# Patient Record
Sex: Male | Born: 2000
Health system: Southern US, Community
[De-identification: ages and names within clinical notes are randomized; demographics above are authoritative.]

## PROBLEM LIST (undated history)

## (undated) DIAGNOSIS — G4700 Insomnia, unspecified: Secondary | ICD-10-CM

## (undated) DIAGNOSIS — I10 Essential (primary) hypertension: Secondary | ICD-10-CM

## (undated) DIAGNOSIS — R4587 Impulsiveness: Secondary | ICD-10-CM

## (undated) HISTORY — PX: TONSILLECTOMY: SUR1361

---
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
## 2019-07-30 IMAGING — DX DG FINGER THUMB 2+V*L*
3 series · 3 of 3 positions shown · non-contrast
Comparison: None.

CLINICAL DATA: Left thumb injury, football injury.

EXAM:
LEFT THUMB 2+V

[finger ap]
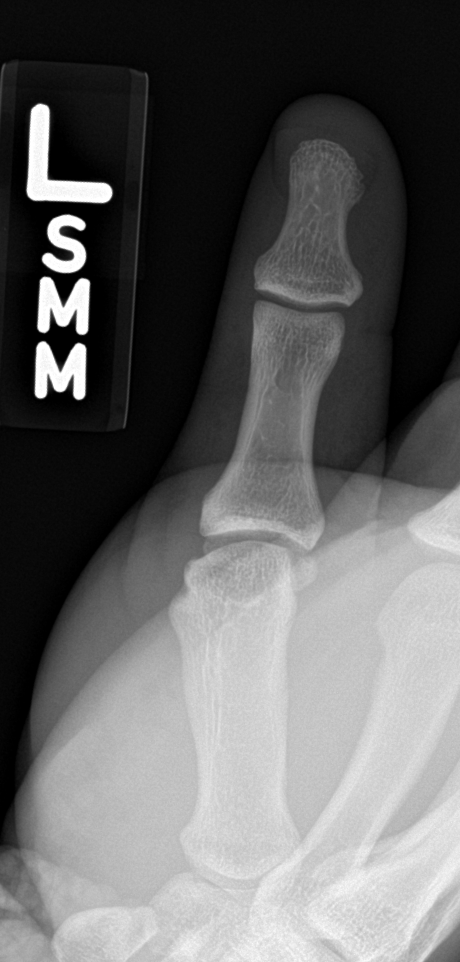

[finger obl]
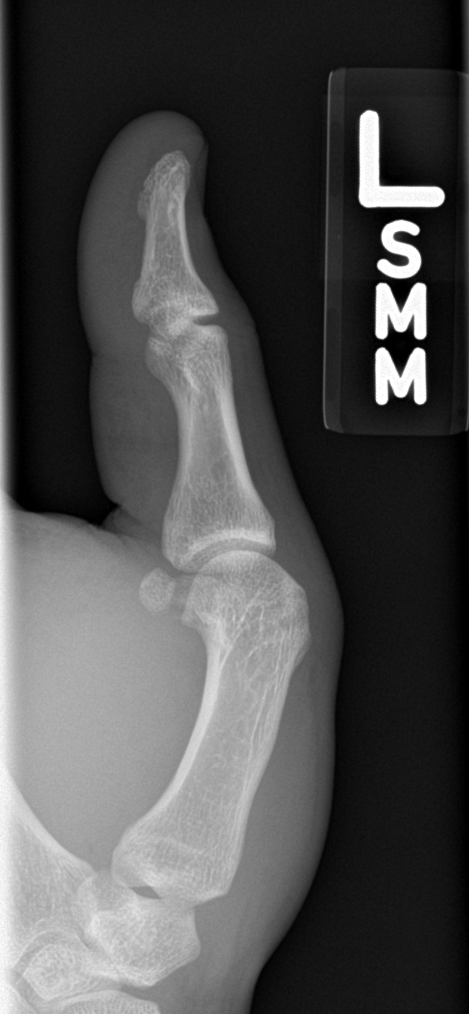

[finger lat]
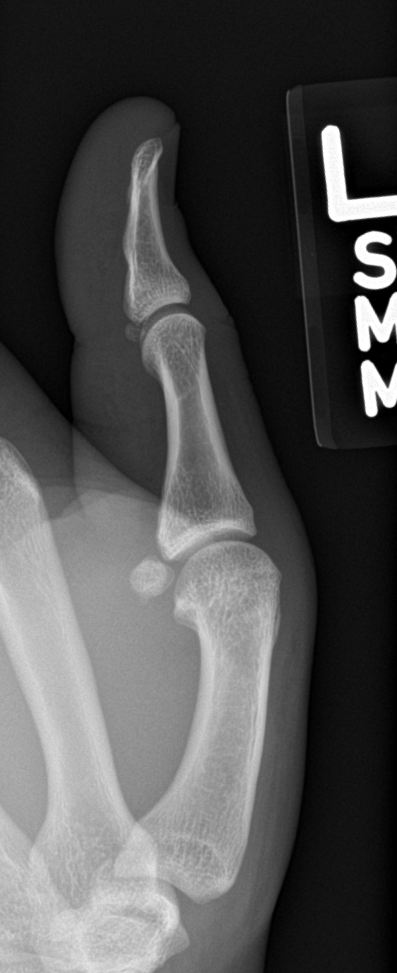

[3 of 3 positions shown; findings below may reference images not displayed]

FINDINGS: There is no evidence of fracture or dislocation. There is no
evidence of arthropathy or other focal bone abnormality. Soft
tissues are unremarkable
IMPRESSION: Negative.

## 2021-03-04 ENCOUNTER — Encounter: Payer: Self-pay | Admitting: Emergency Medicine

## 2021-03-04 ENCOUNTER — Other Ambulatory Visit: Payer: Self-pay

## 2021-03-04 ENCOUNTER — Ambulatory Visit
Admission: EM | Admit: 2021-03-04 | Discharge: 2021-03-04 | Disposition: A | Discharge status: Home / Self Care | Payer: Medicaid Other | Attending: Internal Medicine | Admitting: Internal Medicine

## 2021-03-04 DIAGNOSIS — R030 Elevated blood-pressure reading, without diagnosis of hypertension: Secondary | ICD-10-CM

## 2021-03-04 DIAGNOSIS — Z20822 Contact with and (suspected) exposure to covid-19: Secondary | ICD-10-CM

## 2021-03-04 NOTE — ED Triage Notes (Signed)
Intermittent headache for 2 days, worse today.  Has been taking tylenol.  Pain in forehead.  Patient reports this is an unusual headache, he does not get headaches

## 2021-03-04 NOTE — ED Provider Notes (Signed)
Renaldo Fiddler    CSN: [id number] Arrival date & time: 03/04/21  1536      History   Chief Complaint Chief Complaint  Patient presents with   Headache    HPI Edward Leonard is a 20 y.o. male who presents with intermittent HA x 3 days. The HA is worse today. Has been taking Tylenol 500 mg and hs not been helping.  HA is present on forehead. He states this HA is different for him ,than when he has had them before. Has been feeling achy. Has been fatigued and has had decreased appetite. Denies diarrhea or URI symptoms. Denies close exposure to anyone sick.  Has not done a home covid test.  He work in the heat and tried to hydrate well, drinks about 64 oz of water with no electrolytes.     Past Medical History:  Diagnosis Date   Hypertension    Impulsiveness    Insomnia     There are no problems to display for this patient.   Past Surgical History:  Procedure Laterality Date   TONSILLECTOMY         Home Medications    Prior to Admission medications   Not on File    Family History Family History  Family history unknown: Yes    Social History Social History   Tobacco Use   Smoking status: Never   Smokeless tobacco: Never  Vaping Use   Vaping Use: Some days  Substance Use Topics   Alcohol use: Never   Drug use: Yes    Types: Marijuana     Allergies   Patient has no known allergies.   Review of Systems Review of Systems  Constitutional:  Positive for appetite change, chills and fatigue. Negative for activity change and fever.  HENT:  Negative for congestion, ear discharge, ear pain, rhinorrhea and sore throat.   Respiratory:  Negative for cough, chest tightness and shortness of breath.   Gastrointestinal:  Negative for abdominal pain, diarrhea, nausea and vomiting.  Musculoskeletal:  Positive for myalgias.  Skin:  Negative for rash.  Neurological:  Positive for headaches.  Hematological:  Negative for adenopathy.    Physical Exam Triage  Vital Signs ED Triage Vitals  Enc Vitals Group     BP 03/04/21 1550 (!) 146/85     Pulse Rate 03/04/21 1550 91     Resp 03/04/21 1550 18     Temp 03/04/21 1550 99.7 F (37.6 C)     Temp Source 03/04/21 1550 Oral     SpO2 03/04/21 1550 96 %     Weight --      Height --      Head Circumference --      Peak Flow --      Pain Score 03/04/21 1546 8     Pain Loc --      Pain Edu? --      Excl. in GC? --    No data found.  Updated Vital Signs BP (!) 146/85 (BP Location: Left Arm)   Pulse 91   Temp 99.7 F (37.6 C) (Oral)   Resp 18   SpO2 96%   Visual Acuity Right Eye Distance:   Left Eye Distance:   Bilateral Distance:    Right Eye Near:   Left Eye Near:    Bilateral Near:     Physical Exam Physical Exam Vitals signs and nursing note reviewed.  Constitutional:      General: He is not in acute distress.  Appearance: He is well-developed and normal weight. He is not ill-appearing, toxic-appearing or diaphoretic.  HENT: TM's- clear, nose is normal, Pharynx is clear    Head: Normocephalic.  Eyes:     Extraocular Movements: Extraocular movements intact.     Pupils: Pupils are equal, round, and reactive to light.  Neck:     Musculoskeletal: Neck supple. No neck rigidity.     Cardiovascular:     Rate and Rhythm: Normal rate and regular rhythm.     Heart sounds: No murmur.  Pulmonary:     Effort: Pulmonary effort is normal.     Breath sounds: Normal breath sounds. No wheezing, rhonchi or rales.  Abdominal:     General: Bowel sounds are normal.     Palpations: Abdomen is soft. There is no mass.     Tenderness: There is no abdominal tenderness. There is no guarding.  Musculoskeletal: Normal range of motion.  Lymphadenopathy:     Cervical: No cervical adenopathy.  Skin:    General: Skin is warm and dry.  Neurological:     Mental Status: He is alert.     Cranial Nerves: No cranial nerve deficit or facial asymmetry.     Sensory: No sensory deficit.     Motor: No  weakness.     Coordination: Romberg sign negative. Coordination normal.     Gait: Gait normal.     Deep Tendon Reflexes: Reflexes normal.     Comments: Normal Romberg, finger to nose, tandem gait.   Psychiatric:        Mood and Affect: Mood normal.        Speech: Speech normal.        Behavior: Behavior normal.    UC Treatments / Results  Labs (all labs ordered are listed, but only abnormal results are displayed) Labs Reviewed  NOVEL CORONAVIRUS, NAA    EKG   Radiology No results found.  Procedures Procedures (including critical care time)  Medications Ordered in UC Medications - No data to display  Initial Impression / Assessment and Plan / UC Course  I have reviewed the triage vital signs and the nursing notes. Covid suspect. Covid test sent out and we will inform him if positive. See instructions     Final Clinical Impressions(s) / UC Diagnoses   Final diagnoses:  Suspected COVID-19 virus infection  Elevated blood pressure reading     Discharge Instructions      Push more fluids when you are working in the heat Drink at least 2 bottles of electrolytes per shift.  Check on your Mychart to see your results . Take Tylenol 1000 mg every 6 hours for aches and headache.  Stay quarantined until your covid test is back.  If your Covid test ends up positive you may take the following supplements to help your immune system be stronger to fight this viral infection Take Quarcetin 500 mg three times a day x 7 days with Zinc 50 mg ones a day x 7 days. The quarcetin is an antiviral and anti-inflammatory supplement which helps open the zinc channels in the cell to absorb Zinc. Zinc helps decrease the virus load in your body. Take Melatonin 6-10 mg at bed time which also helps support your immune system.  Also make sure to take Vit D 5,000 IU per day with a fatty meal and Vit C 5000 mg a day until you are completely better. To prevent viral illnesses your vitamin D should  be between 60-80. Stay on Vitamin  D 2,000  and C  1000 mg the rest of the season.  Don't lay around, keep active and walk as much as you are able to to prevent worsening of your symptoms.  Follow up with your family Dr next week.  If you get short of breath and you are able to check  your oxygen with a pulse oxygen meter, if it gets to 92% or less, you need to go to the hospital to be admitted. If you dont have one, come back here and we will assess you.    Do blood pressure readings and if you have 3 or more reading 140/90 or more you need to get back on your blood pressure medication. So go get established with a primary care doctor       ED Prescriptions   None    PDMP not reviewed this encounter.   Garey Ham, [place] 03/04/21 1642

## 2021-03-04 NOTE — Discharge Instructions (Addendum)
Push more fluids when you are working in the heat Drink at least 2 bottles of electrolytes per shift.  Check on your Mychart to see your results . Take Tylenol 1000 mg every 6 hours for aches and headache.  Stay quarantined until your covid test is back.  If your Covid test ends up positive you may take the following supplements to help your immune system be stronger to fight this viral infection Take Quarcetin 500 mg three times a day x 7 days with Zinc 50 mg ones a day x 7 days. The quarcetin is an antiviral and anti-inflammatory supplement which helps open the zinc channels in the cell to absorb Zinc. Zinc helps decrease the virus load in your body. Take Melatonin 6-10 mg at bed time which also helps support your immune system.  Also make sure to take Vit D 5,000 IU per day with a fatty meal and Vit C 5000 mg a day until you are completely better. To prevent viral illnesses your vitamin D should be between 60-80. Stay on Vitamin D 2,000  and C  1000 mg the rest of the season.  Don't lay around, keep active and walk as much as you are able to to prevent worsening of your symptoms.  Follow up with your family Dr next week.  If you get short of breath and you are able to check  your oxygen with a pulse oxygen meter, if it gets to 92% or less, you need to go to the hospital to be admitted. If you dont have one, come back here and we will assess you.    Do blood pressure readings and if you have 3 or more reading 140/90 or more you need to get back on your blood pressure medication. So go get established with a primary care doctor

## 2021-03-07 LAB — NOVEL CORONAVIRUS, NAA

## 2021-03-08 ENCOUNTER — Other Ambulatory Visit: Payer: Self-pay

## 2021-03-08 ENCOUNTER — Ambulatory Visit
Admission: EM | Admit: 2021-03-08 | Discharge: 2021-03-08 | Disposition: A | Discharge status: Home / Self Care | Payer: Medicaid Other | Attending: Emergency Medicine | Admitting: Emergency Medicine

## 2021-03-08 DIAGNOSIS — Z1152 Encounter for screening for COVID-19: Secondary | ICD-10-CM

## 2021-03-08 NOTE — ED Triage Notes (Signed)
Pt needs recollection of covid

## 2021-03-10 LAB — SARS-COV-2, NAA 2 DAY TAT

## 2021-03-10 LAB — NOVEL CORONAVIRUS, NAA: SARS-CoV-2, NAA: DETECTED — AB

## 2021-04-29 ENCOUNTER — Ambulatory Visit
Admission: RE | Admit: 2021-04-29 | Discharge: 2021-04-29 | Disposition: A | Discharge status: Home / Self Care | Payer: Medicaid Other | Source: Ambulatory Visit | Attending: Urgent Care | Admitting: Urgent Care

## 2021-04-29 ENCOUNTER — Other Ambulatory Visit: Payer: Self-pay

## 2021-04-29 ENCOUNTER — Ambulatory Visit (INDEPENDENT_AMBULATORY_CARE_PROVIDER_SITE_OTHER): Payer: Medicaid Other

## 2021-04-29 VITALS — BP 159/89 | HR 78 | Temp 99.1°F

## 2021-04-29 DIAGNOSIS — M25562 Pain in left knee: Secondary | ICD-10-CM

## 2021-04-29 DIAGNOSIS — S86912A Strain of unspecified muscle(s) and tendon(s) at lower leg level, left leg, initial encounter: Secondary | ICD-10-CM

## 2021-04-29 MED ORDER — NAPROXEN 500 MG PO TABS: 500.0000 mg | ORAL_TABLET | Freq: Two times a day (BID) | ORAL | 0 refills | Status: DC

## 2021-04-29 NOTE — Discharge Instructions (Addendum)
I am going to manage your knee pain for knee strain.  Please use naproxen for pain and inflammation.  Use the Ace wrap during the day for the next week.  If your knee pain continues then follow-up with [facility] that they can pursue more imaging.

## 2021-04-29 NOTE — ED Triage Notes (Signed)
Pt injured left leg jumping on a trampoline x 3 days

## 2021-04-29 NOTE — ED Provider Notes (Signed)
Edward Leonard   MRN: [id number] DOB: 05-Mar-2001  Subjective:   Edward Leonard is a 20 y.o. male presenting for 3-day history of persistent left knee pain over the back of his knee.  He has had difficulty bending and straightening out his leg.  Symptoms started when he was at a trampoline park, was at the parkour area and lost his grip, as he came down landed awkwardly and reports that his knee came all the way up to his chest.  Has since had his current symptoms.  No current facility-administered medications for this encounter. No current outpatient medications on file.   No Known Allergies  Past Medical History:  Diagnosis Date   Hypertension    Impulsiveness    Insomnia      Past Surgical History:  Procedure Laterality Date   TONSILLECTOMY      Family History  Family history unknown: Yes    Social History   Tobacco Use   Smoking status: Never   Smokeless tobacco: Never  Vaping Use   Vaping Use: Some days  Substance Use Topics   Alcohol use: Never   Drug use: Yes    Types: Marijuana    ROS   Objective:   Vitals: BP (!) 159/89 (BP Location: Left Arm)   Pulse 78   Temp 99.1 F (37.3 C) (Oral)   SpO2 97%   Physical Exam Constitutional:      General: He is not in acute distress.    Appearance: Normal appearance. He is well-developed and normal weight. He is not ill-appearing, toxic-appearing or diaphoretic.  HENT:     Head: Normocephalic and atraumatic.     Right Ear: External ear normal.     Left Ear: External ear normal.     Nose: Nose normal.     Mouth/Throat:     Pharynx: Oropharynx is clear.  Eyes:     General: No scleral icterus.       Right eye: No discharge.        Left eye: No discharge.     Extraocular Movements: Extraocular movements intact.     Pupils: Pupils are equal, round, and reactive to light.  Cardiovascular:     Rate and Rhythm: Normal rate.  Pulmonary:     Effort: Pulmonary effort is normal.  Musculoskeletal:      Cervical back: Normal range of motion.     Left knee: No swelling, deformity, erythema, ecchymosis, lacerations, bony tenderness or crepitus. Decreased range of motion. Tenderness (Posteriorly) present. No medial joint line, lateral joint line or patellar tendon tenderness. Normal alignment and normal patellar mobility.  Neurological:     Mental Status: He is alert and oriented to person, place, and time.  Psychiatric:        Mood and Affect: Mood normal.        Behavior: Behavior normal.        Thought Content: Thought content normal.        Judgment: Judgment normal.    DG Knee Complete 4 Views Left  Result Date: 04/29/2021 CLINICAL DATA:  Left knee pain. EXAM: LEFT KNEE - COMPLETE 4+ VIEW COMPARISON:  None. FINDINGS: No evidence of fracture, dislocation, or joint effusion. No evidence of arthropathy or other focal bone abnormality. Soft tissues are unremarkable. IMPRESSION: Negative. Electronically Signed   By: Signa Kell M.D.   On: 04/29/2021 09:22     Assessment and Plan :   PDMP not reviewed this encounter.  1. Acute pain of left knee  2. Knee strain, left, initial encounter    Will manage conservatively for knee strain with NSAID, rest and modification of physical activity.  Anticipatory guidance provided.  Follow-up with Ortho if symptoms persist.  A 4 inch Ace wrap was applied to the left knee.  Counseled patient on potential for adverse effects with medications prescribed/recommended today, ER and return-to-clinic precautions discussed, patient verbalized understanding.     Edward Leonard, [place] 04/29/21 [phone]

## 2021-11-12 ENCOUNTER — Ambulatory Visit (HOSPITAL_COMMUNITY)
Admission: EM | Admit: 2021-11-12 | Discharge: 2021-11-12 | Disposition: A | Discharge status: Home / Self Care | Payer: Medicaid Other

## 2021-11-12 ENCOUNTER — Ambulatory Visit (INDEPENDENT_AMBULATORY_CARE_PROVIDER_SITE_OTHER): Payer: Medicaid Other

## 2021-11-12 ENCOUNTER — Encounter (HOSPITAL_COMMUNITY): Payer: Self-pay | Admitting: Emergency Medicine

## 2021-11-12 DIAGNOSIS — M79672 Pain in left foot: Secondary | ICD-10-CM

## 2021-11-12 NOTE — ED Triage Notes (Signed)
Patient c/o LFT foot pain x 2 days.  ? ?Patient endorses hitting foot at the "corner of the bed trying to go down stairs".  ? ?Patient has bruising present to dorsal side of foot.  ? ?Patient endorses increased pain with movement and ambulation.  ? ?Patient has taken Tylenol with no relief of symptoms.  ?

## 2021-11-12 NOTE — Discharge Instructions (Signed)
As we discussed, your x-ray did not show any fractures.  This is likely just a bruise.  It will continue to improve over the next week.  You can ice it, you can also use Tylenol or ibuprofen as needed for the pain.  It is okay for you to go back to work.  Come back if your pain is not improving over the next 1 to 2 weeks. ?

## 2021-11-12 NOTE — ED Provider Notes (Signed)
?[facility] ? ? ? ?CSN: [id number] ?Arrival date & time: 11/12/21  1120 ? ? ?  ? ?History   ?Chief Complaint ?Chief Complaint  ?Patient presents with  ? Foot Injury  ? ? ?HPI ?Edward Leonard is a 21 y.o. male.  ? ?Left hip pain ?Reports that he hit his left foot on the bed 2 days ago ?He had some bruising on the dorsal aspect of his foot near the second and third toes ?He went to work yesterday, but given that he had had an injury, they recommended that he be evaluated to ensure that he is okay to work ?He states that his sore and bruised, the swelling has been improving ?He is able to move his toes per his report ?States that walking has been fine ? ? ?Past Medical History:  ?Diagnosis Date  ? Hypertension   ? Impulsiveness   ? Insomnia   ? ? ?There are no problems to display for this patient. ? ? ?Past Surgical History:  ?Procedure Laterality Date  ? TONSILLECTOMY    ? ? ? ? ? ?Home Medications   ? ?Prior to Admission medications   ?Medication Sig Start Date End Date Taking? Authorizing Provider  ?acetaminophen (TYLENOL) 500 MG tablet Take 500 mg by mouth every 6 (six) hours as needed.   Yes [provider]  ?naproxen (NAPROSYN) 500 MG tablet Take 1 tablet (500 mg total) by mouth 2 (two) times daily with a meal. 04/29/21   Wallis Bamberg, PA-C  ? ? ?Family History ?Family History  ?Family history unknown: Yes  ? ? ?Social History ?Social History  ? ?Tobacco Use  ? Smoking status: Never  ? Smokeless tobacco: Never  ?Vaping Use  ? Vaping Use: Some days  ?Substance Use Topics  ? Alcohol use: Never  ? Drug use: Yes  ?  Types: Marijuana  ? ? ? ?Allergies   ?Patient has no known allergies. ? ? ?Review of Systems ?Review of Systems  ?All other systems reviewed and are negative. ? ?Per HPI ?Physical Exam ?Triage Vital Signs ?ED Triage Vitals [11/12/21 1210]  ?Enc Vitals Group  ?   BP   ?   Pulse   ?   Resp   ?   Temp   ?   Temp src   ?   SpO2   ?   Weight   ?   Height   ?   Head Circumference   ?   Peak  Flow   ?   Pain Score 8  ?   Pain Loc   ?   Pain Edu?   ?   Excl. in GC?   ? ?No data found. ? ?Updated Vital Signs ?BP (!) 136/93 (BP Location: Left Arm)   Pulse 72   Temp 98.1 ?F (36.7 ?C) (Oral)   Resp 16   SpO2 95%  ? ?Visual Acuity ?Right Eye Distance:   ?Left Eye Distance:   ?Bilateral Distance:   ? ?Right Eye Near:   ?Left Eye Near:    ?Bilateral Near:    ? ?Physical Exam ?Constitutional:   ?   General: He is not in acute distress. ?   Appearance: Normal appearance. He is not ill-appearing.  ?HENT:  ?   Head: Normocephalic and atraumatic.  ?Eyes:  ?   Conjunctiva/sclera: Conjunctivae normal.  ?Cardiovascular:  ?   Rate and Rhythm: Normal rate.  ?Pulmonary:  ?   Effort: Pulmonary effort is normal. No respiratory distress.  ?Musculoskeletal:  ?  Cervical back: Normal range of motion.  ?   Comments: Left foot: ?Inspection:  No obvious bony deformity b/l.  There is mild dorsal swelling and a small amount of ecchymosis at the base of the second and third digits. ?Palpation: There is mild tenderness palpation in the region of the proximal phalanx of the second and third digits, otherwise no specific tenderness to palpation.  None over midfoot, fifth metatarsal, medial, lateral malleolus specifically. ?ROM: Full  ROM of the ankle b/l.  Able to move all toes. ?Strength: 5/5 strength ankle in all planes b/l ?Neurovascular: N/V intact distally in the lower extremity b/l ? ?  ?Skin: ?   General: Skin is warm and dry.  ?Neurological:  ?   Mental Status: He is alert and oriented to person, place, and time.  ?Psychiatric:     ?   Mood and Affect: Mood normal.     ?   Behavior: Behavior normal.  ? ? ? ?UC Treatments / Results  ?Labs ?(all labs ordered are listed, but only abnormal results are displayed) ?Labs Reviewed - No data to display ? ?EKG ? ? ?Radiology ?DG Foot Complete Left ? ?Result Date: 11/12/2021 ?CLINICAL DATA:  Trauma, pain x2 days EXAM: LEFT FOOT - COMPLETE 3+ VIEW COMPARISON:  05/15/2017 FINDINGS: No  fracture or dislocation is seen. There is soft tissue swelling over the dorsum. Small bony spurs are noted in the dorsal aspect of talonavicular joint. No significant interval changes are noted. IMPRESSION: No recent fracture or dislocation is seen in the left foot. Electronically Signed   By: Ernie Avena M.D.   On: 11/12/2021 12:46   ? ?Procedures ?Procedures (including critical care time) ? ?Medications Ordered in UC ?Medications - No data to display ? ?Initial Impression / Assessment and Plan / UC Course  ?I have reviewed the triage vital signs and the nursing notes. ? ?Pertinent labs & imaging results that were available during my care of the patient were reviewed by me and considered in my medical decision making (see chart for details). ? ?  ? ?X-rays did not show any fractures or dislocation.  Exam consistent with a contusion.  Patient given a note that states he is able to go back to work.  Recommend ice, oral Tylenol or ibuprofen as needed for pain. ? ? ?Final Clinical Impressions(s) / UC Diagnoses  ? ?Final diagnoses:  ?Left foot pain  ? ? ? ?Discharge Instructions   ? ?  ?As we discussed, your x-ray did not show any fractures.  This is likely just a bruise.  It will continue to improve over the next week.  You can ice it, you can also use Tylenol or ibuprofen as needed for the pain.  It is okay for you to go back to work.  Come back if your pain is not improving over the next 1 to 2 weeks. ? ? ? ? ?ED Prescriptions   ?None ?  ? ?PDMP not reviewed this encounter. ?  ?Unknown Jim, DO ?11/12/21 1251 ? ?

## 2023-01-16 DIAGNOSIS — R110 Nausea: Secondary | ICD-10-CM | POA: Diagnosis not present

## 2023-01-16 DIAGNOSIS — T675XXA Heat exhaustion, unspecified, initial encounter: Secondary | ICD-10-CM | POA: Diagnosis not present

## 2023-01-16 DIAGNOSIS — N289 Disorder of kidney and ureter, unspecified: Secondary | ICD-10-CM | POA: Diagnosis not present

## 2023-01-16 DIAGNOSIS — M791 Myalgia, unspecified site: Secondary | ICD-10-CM | POA: Diagnosis not present

## 2023-01-16 DIAGNOSIS — X30XXXA Exposure to excessive natural heat, initial encounter: Secondary | ICD-10-CM | POA: Diagnosis not present

## 2023-01-16 DIAGNOSIS — R42 Dizziness and giddiness: Secondary | ICD-10-CM | POA: Diagnosis not present

## 2023-01-16 DIAGNOSIS — E860 Dehydration: Secondary | ICD-10-CM | POA: Diagnosis not present

## 2023-01-16 DIAGNOSIS — Z753 Unavailability and inaccessibility of health-care facilities: Secondary | ICD-10-CM | POA: Diagnosis not present

## 2023-02-04 DEATH — deceased
